# Patient Record
Sex: Female | Born: 1980 | Hispanic: No | Marital: Married | Smoking: Never smoker
Health system: Southern US, Community
[De-identification: ages and names within clinical notes are randomized; demographics above are authoritative.]

## PROBLEM LIST (undated history)

## (undated) DIAGNOSIS — I10 Essential (primary) hypertension: Secondary | ICD-10-CM

---

## 2018-12-10 ENCOUNTER — Other Ambulatory Visit (HOSPITAL_COMMUNITY): Payer: Self-pay | Admitting: *Deleted

## 2018-12-10 DIAGNOSIS — N61 Mastitis without abscess: Secondary | ICD-10-CM

## 2018-12-13 ENCOUNTER — Telehealth (HOSPITAL_COMMUNITY): Payer: Self-pay | Admitting: *Deleted

## 2018-12-13 ENCOUNTER — Encounter (HOSPITAL_COMMUNITY): Payer: Self-pay | Admitting: *Deleted

## 2018-12-13 NOTE — Telephone Encounter (Signed)
Telephoned patient at home number. Confirmed appointment for March 31. No symptoms of COVID-19. No travel outside of [place] in the last 14 days. No contact with someone with a confirmed diagnosis of COVID-19   

## 2018-12-14 ENCOUNTER — Ambulatory Visit
Admission: RE | Admit: 2018-12-14 | Discharge: 2018-12-14 | Disposition: A | Discharge status: Home / Self Care | Payer: No Typology Code available for payment source | Source: Ambulatory Visit | Attending: Obstetrics and Gynecology | Admitting: Obstetrics and Gynecology

## 2018-12-14 ENCOUNTER — Other Ambulatory Visit (HOSPITAL_COMMUNITY): Payer: Self-pay | Admitting: Obstetrics and Gynecology

## 2018-12-14 ENCOUNTER — Other Ambulatory Visit: Payer: Self-pay

## 2018-12-14 ENCOUNTER — Ambulatory Visit (HOSPITAL_COMMUNITY)
Admission: RE | Admit: 2018-12-14 | Discharge: 2018-12-14 | Disposition: A | Discharge status: Home / Self Care | Payer: Self-pay | Source: Ambulatory Visit | Attending: Obstetrics and Gynecology | Admitting: Obstetrics and Gynecology

## 2018-12-14 ENCOUNTER — Encounter (HOSPITAL_COMMUNITY): Payer: Self-pay

## 2018-12-14 VITALS — BP 146/102 | Temp 97.9°F | Wt 129.0 lb

## 2018-12-14 DIAGNOSIS — N6310 Unspecified lump in the right breast, unspecified quadrant: Secondary | ICD-10-CM

## 2018-12-14 DIAGNOSIS — N63 Unspecified lump in unspecified breast: Secondary | ICD-10-CM

## 2018-12-14 DIAGNOSIS — R599 Enlarged lymph nodes, unspecified: Secondary | ICD-10-CM

## 2018-12-14 DIAGNOSIS — N61 Mastitis without abscess: Secondary | ICD-10-CM

## 2018-12-14 DIAGNOSIS — Z1239 Encounter for other screening for malignant neoplasm of breast: Secondary | ICD-10-CM

## 2018-12-14 DIAGNOSIS — N6325 Unspecified lump in the left breast, overlapping quadrants: Secondary | ICD-10-CM

## 2018-12-14 DIAGNOSIS — N6320 Unspecified lump in the left breast, unspecified quadrant: Secondary | ICD-10-CM

## 2018-12-14 DIAGNOSIS — N644 Mastodynia: Secondary | ICD-10-CM

## 2018-12-14 HISTORY — DX: Essential (primary) hypertension: I10

## 2018-12-14 NOTE — Progress Notes (Signed)
Complaints of right breast pain and a change in the vein pattern since February 2020. Patient states the pain is constant. Patient rates the pain at a 10 out of 10. Patient is currently breast feeding and taking Bactrim.  Pap Smear: Pap smear not completed today. Last Pap smear was 06/13/2016 at the [facility] and normal. Per patient has no history of an abnormal Pap smear. Last Pap smear result is in Epic.  Physical exam: Breasts Right breast larger than left breast that per patient has been a change over the past month. No skin abnormalities left breast. Right breast reddened around the areola and a prominent vein observed within the right outer breast that per patient has been a change. No nipple retraction bilateral breasts. No nipple discharge bilateral breasts. No lymphadenopathy bilateral breasts. Right breast swollen. Palpated a lump within the left breast at 3 o'clock 7 cm from the nipple. No discrete palpable mass within the right breast. Complaints of right breast tenderness on exam. Referred patient to the [facility] for a diagnostic mammogram and bilateral breast ultrasound. Appointment scheduled for Tuesday, December 14, 2018 at 1445.        Pelvic/Bimanual No Pap smear completed today since last Pap smear was 06/13/2016. Pap smear not indicated per BCCCP guidelines.   Smoking History: Patient has never smoked.  Patient Navigation: Patient education provided. Access to services provided for patient through [facility] program. [employer] Spire interpreter provided.   Breast and Cervical Cancer Risk Assessment: Patient has no family history of breast cancer, known genetic mutations, or radiation treatment to the chest before age 58. Patient has no history of cervical dysplasia, immunocompromised, or DES exposure in-utero.  Risk Assessment    Risk Scores      12/14/2018   Last edited by: Lynnell Dike, LPN   5-year risk: 0.3 %   Lifetime risk: 6.8 %         Used [place] interpreter [employer] from [employer].

## 2018-12-14 NOTE — Patient Instructions (Signed)
Explained breast self awareness with Henry Milnes. Patient did not need a Pap smear today due to last Pap smear was 06/13/2016. Let her know BCCCP will cover Pap smears every 3 years unless has a history of abnormal Pap smears. Referred patient to the [facility] for a diagnostic mammogram and bilateral breast ultrasound. Appointment scheduled for Tuesday, December 14, 2018 at 1445. Patient aware of appointment and will be there. Burkley Basu verbalized understanding.  Brannock, Kathaleen Maser, RN 2:38 PM

## 2018-12-15 ENCOUNTER — Other Ambulatory Visit: Payer: Self-pay

## 2019-01-27 ENCOUNTER — Encounter (HOSPITAL_COMMUNITY): Payer: Self-pay | Admitting: *Deleted

## 2019-11-19 IMAGING — MG MM CLIP PLACEMENT
3 series · 3 of 3 positions shown · non-contrast
Comparison: Previous exam(s).

CLINICAL DATA: Post biopsy mammogram of the right breast for clip
placement.

EXAM:
DIAGNOSTIC RIGHT MAMMOGRAM POST ULTRASOUND BIOPSY

[R ML]
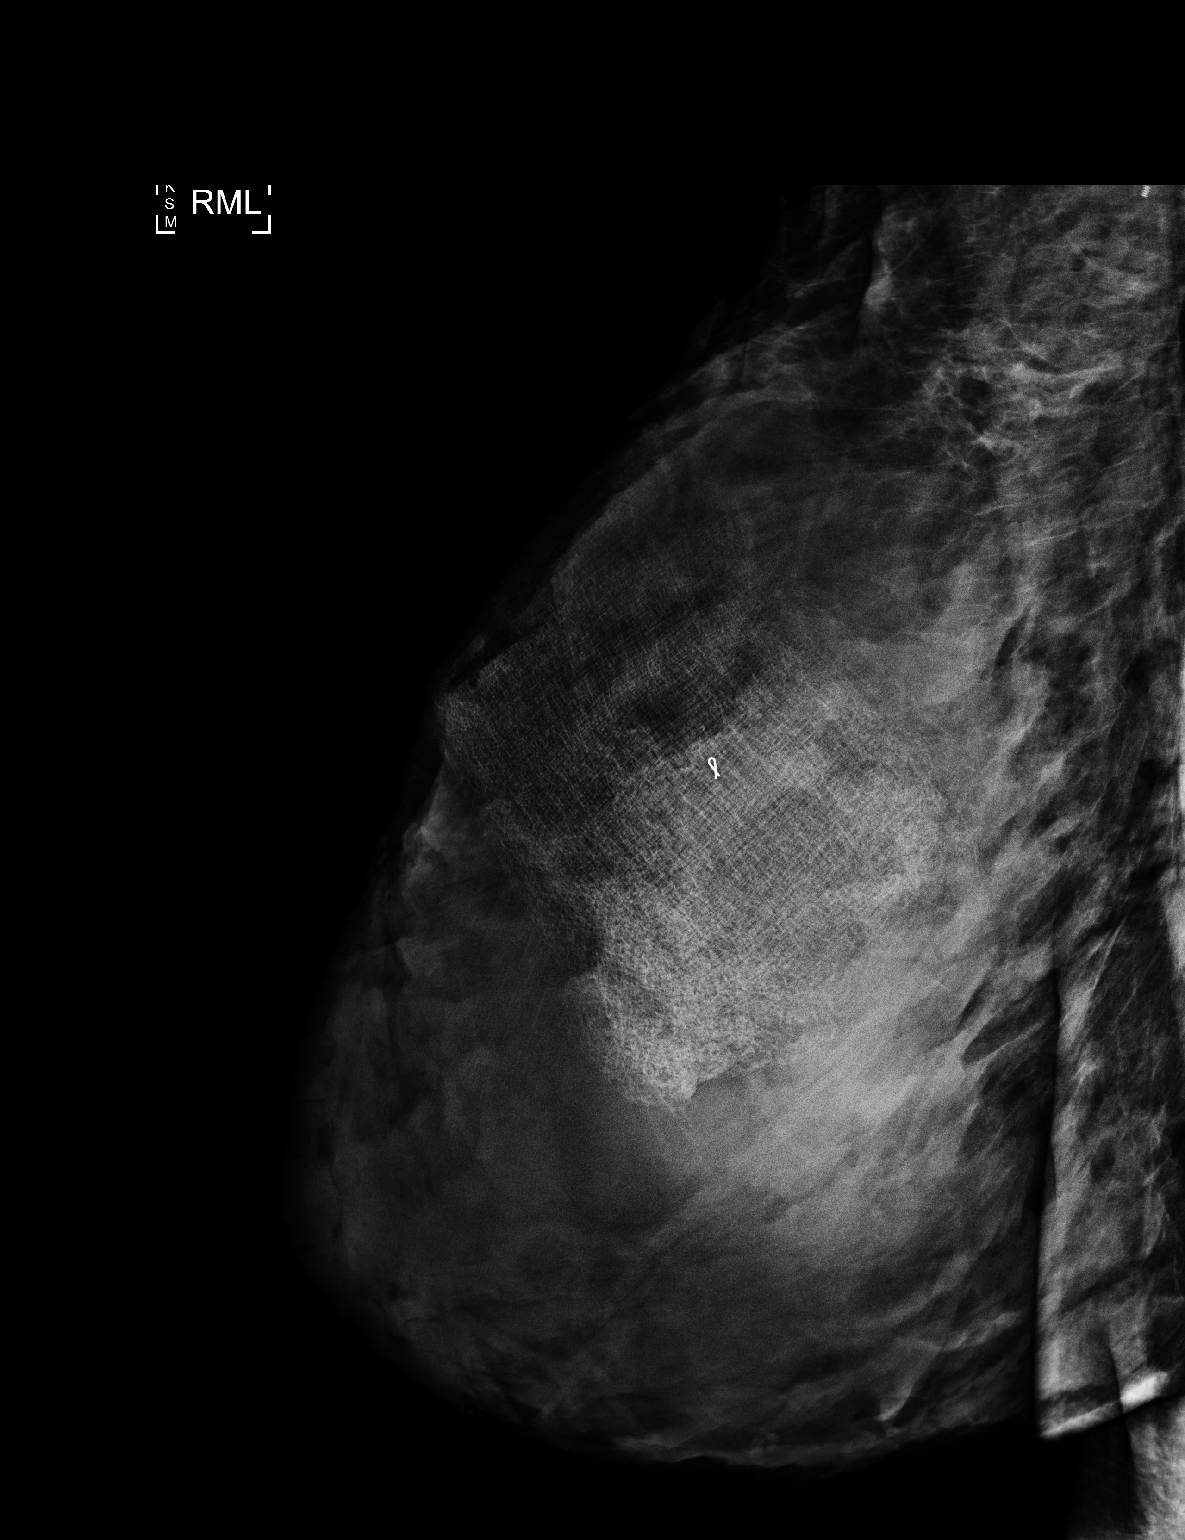

[R MLO]
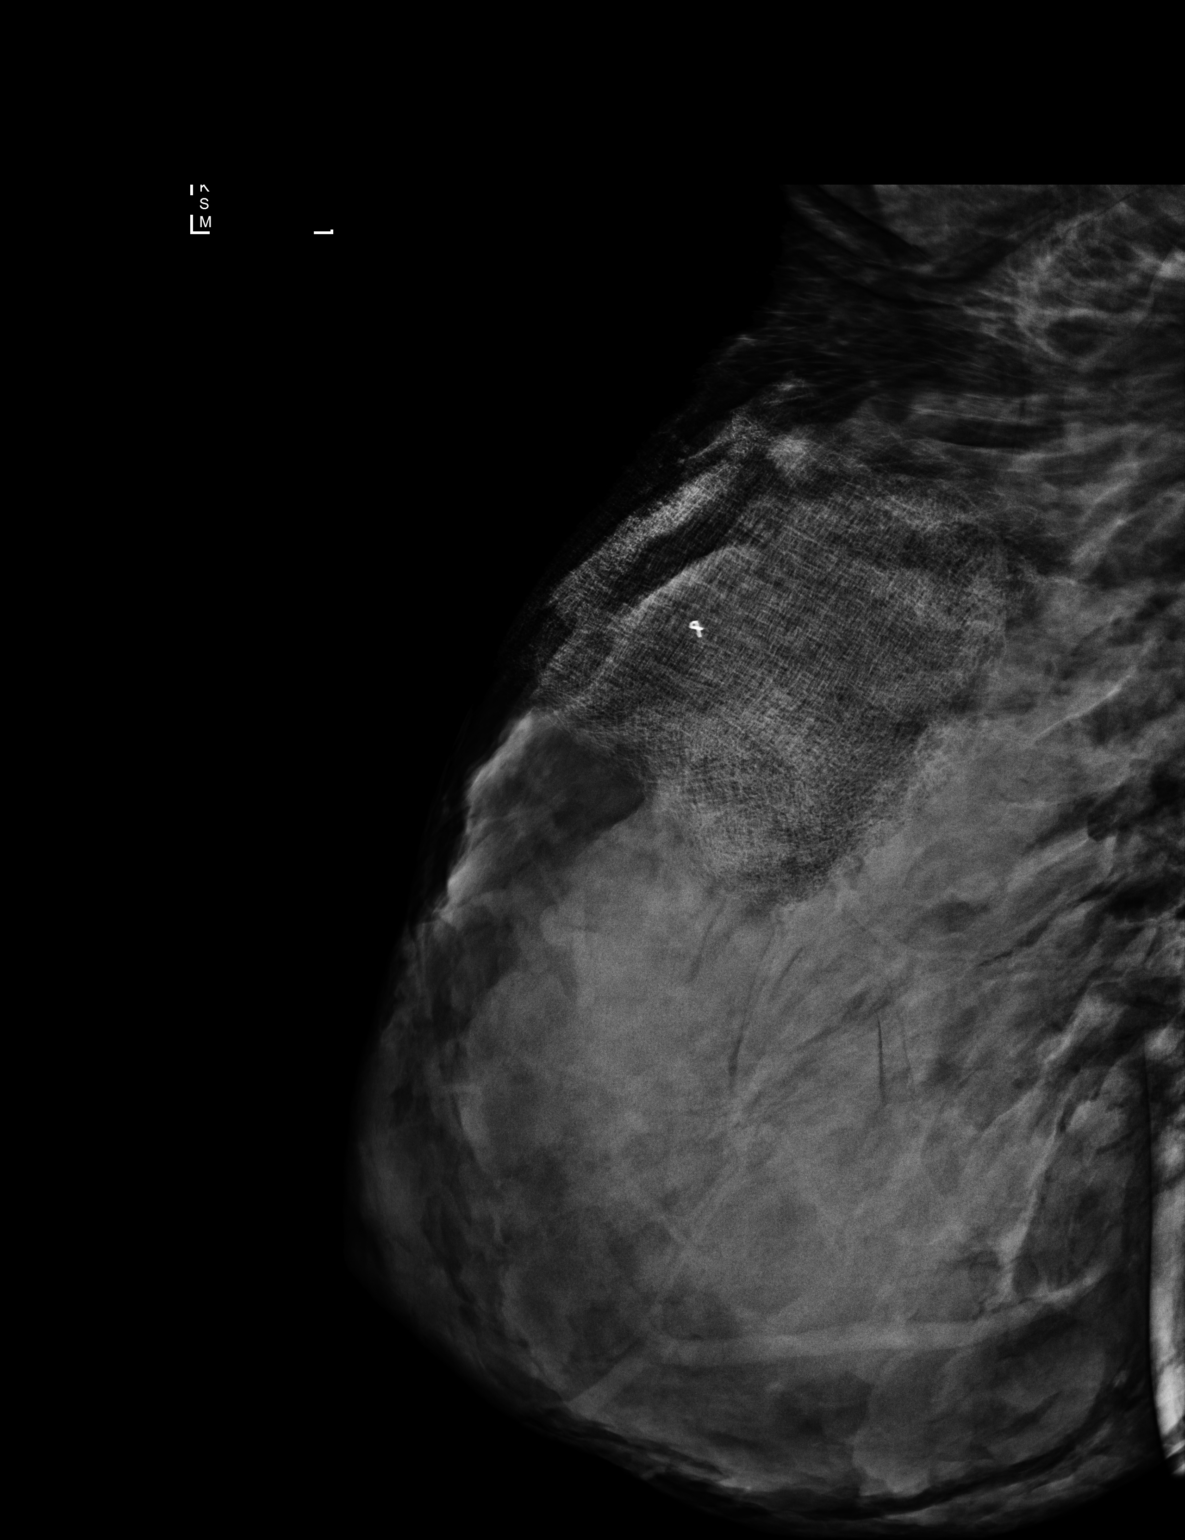

[R CC]
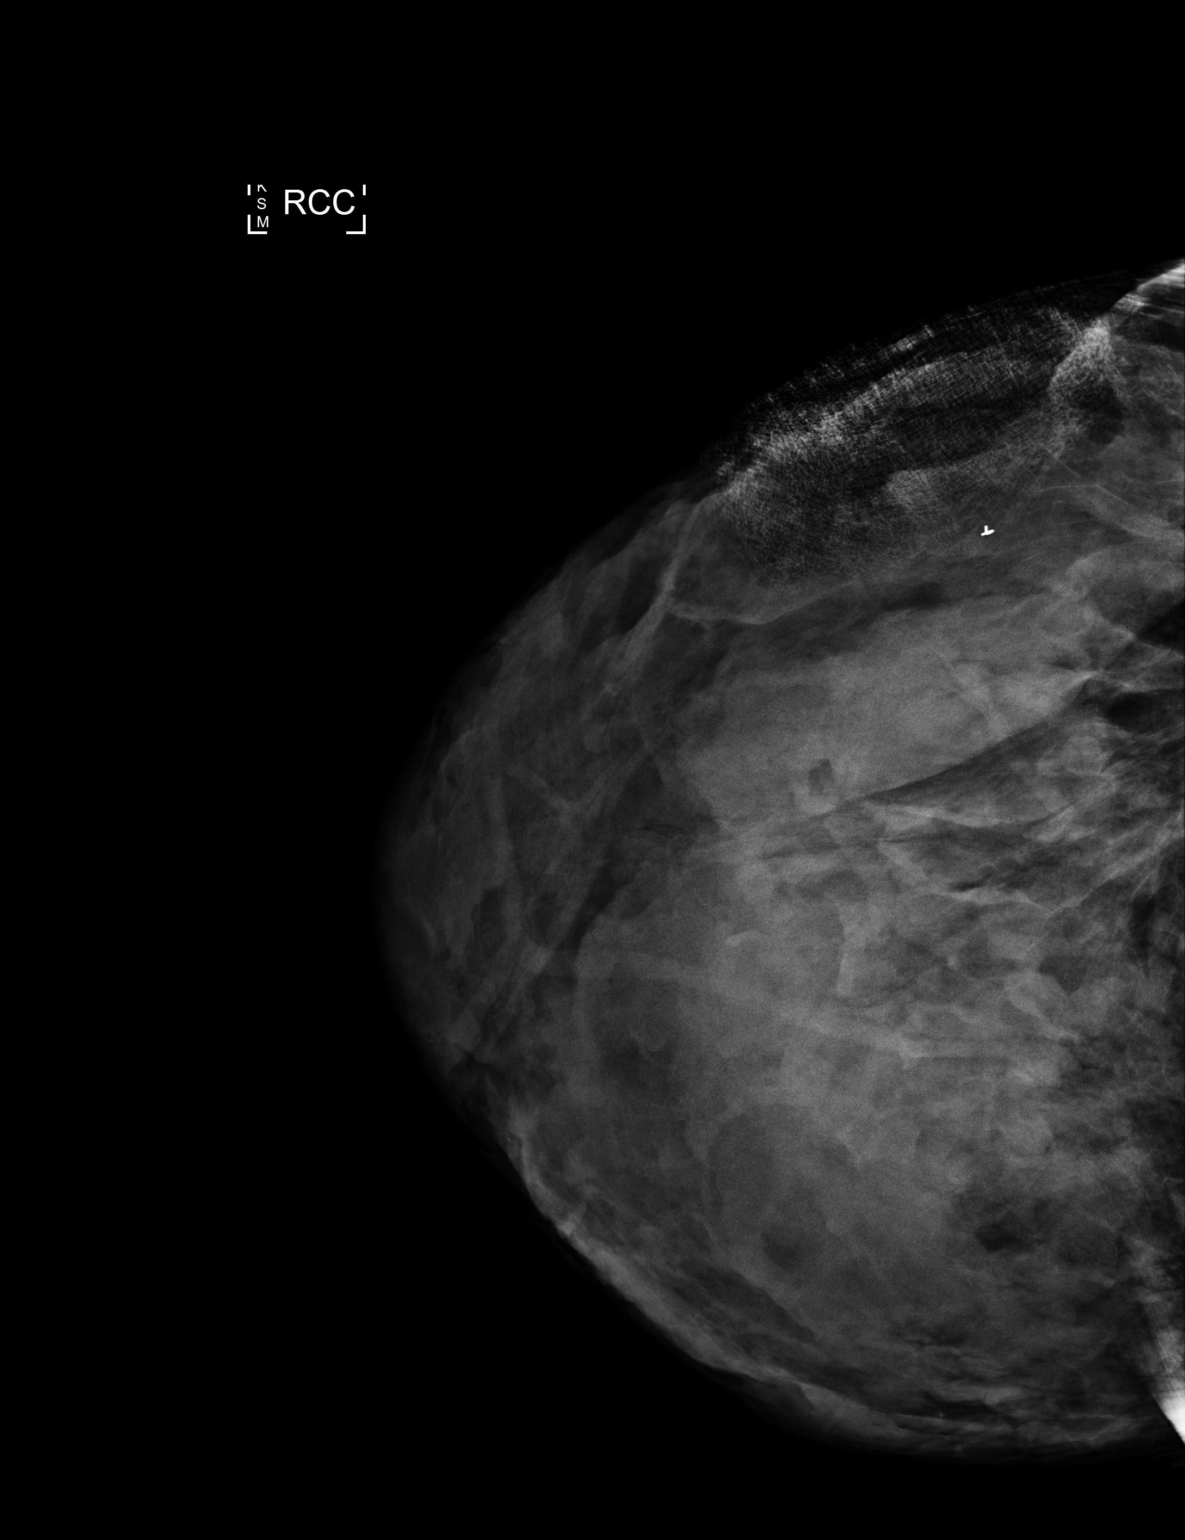

[3 of 3 positions shown; findings below may reference images not displayed]

FINDINGS: Mammographic images were obtained following ultrasound guided biopsy
of an area of shadowing in the upper-outer right breast and a right
axillary lymph node. The ribbon shaped biopsy marking clip is
appropriately positioned in the upper-outer quadrant of the right
breast. The spiral shaped HydroMARK clip is seen in the right
axilla.
IMPRESSION: 1. Appropriate positioning of the ribbon shaped biopsy marking clip
in the upper-outer right breast.

2. Appropriate positioning of the HydroMARK spiral shaped clip in
the right axilla.

Final Assessment: Post Procedure Mammograms for Marker Placement

## 2019-11-19 IMAGING — US US BREAST BX W LOC DEV 1ST LESION IMG BX SPEC US GUIDE*R*
1 series · 14 of 16 positions shown · non-contrast
Comparison: Previous exam(s).
COMPARISON: Previous exam(s).
COMPARISON: Previous exam(s).

Addendum:
CLINICAL DATA: 38-year-old female presenting for ultrasound-guided
biopsy of a right breast area of shadowing and a right axillary
lymph node.

EXAM:
ULTRASOUND GUIDED RIGHT BREAST CORE NEEDLE BIOPSY

[Series 1: us breast bx w loc dev 1st lesion img bx spec us g · 0.07mm/px · 14 of 16 slices shown]
[im 1/16]
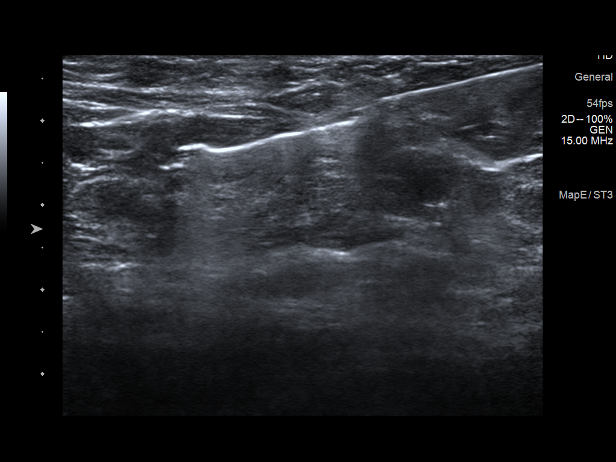
[im 2/16]
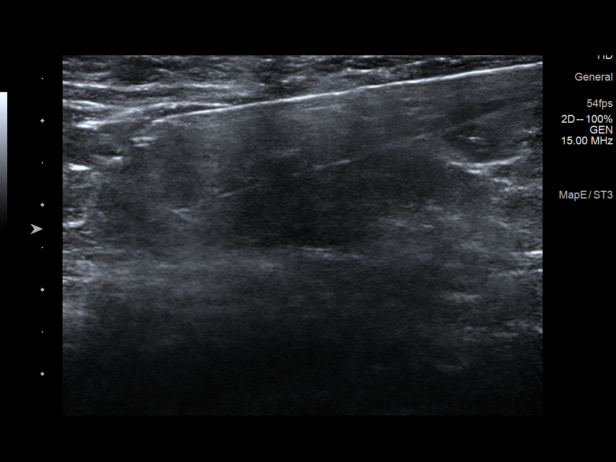
[im 3/16]
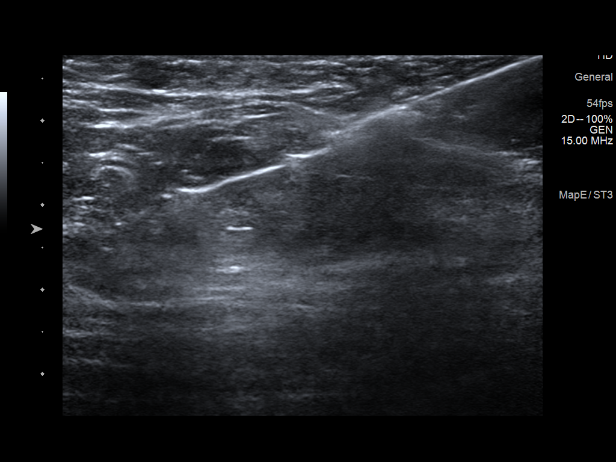
[im 5/16]
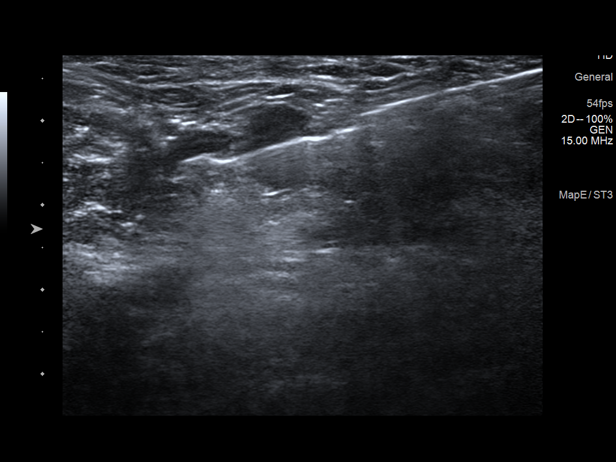
[im 6/16]
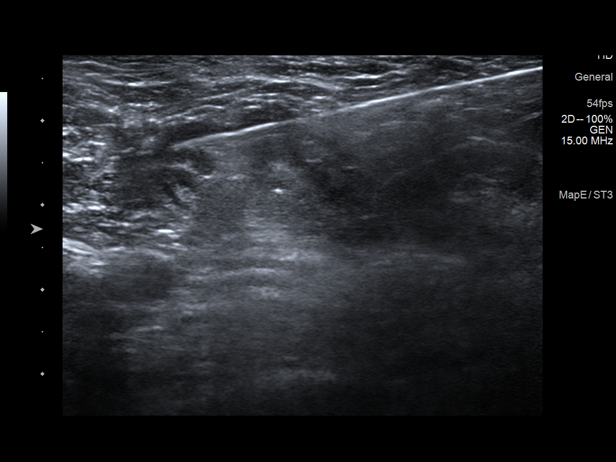
[im 7/16]
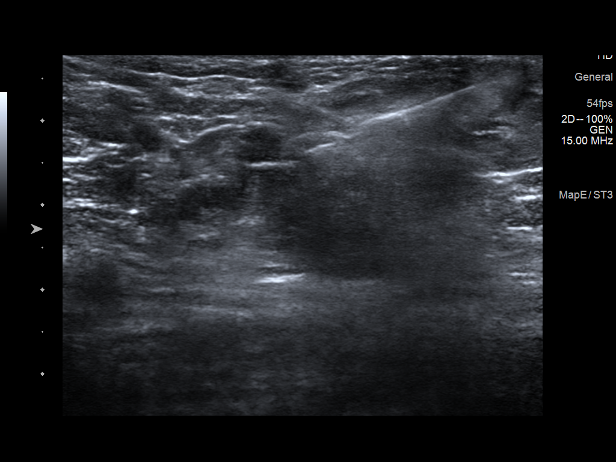
[im 8/16]
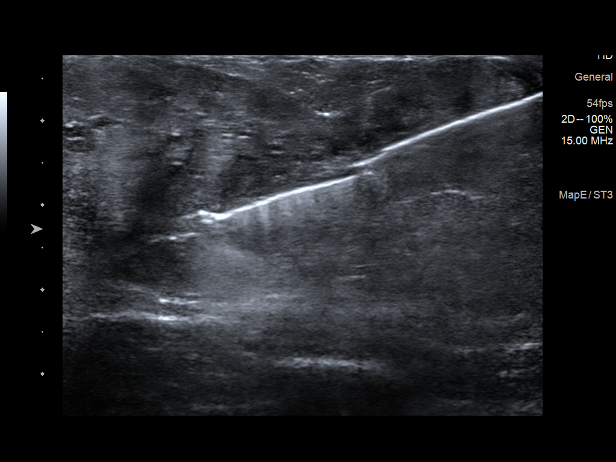
[im 9/16]
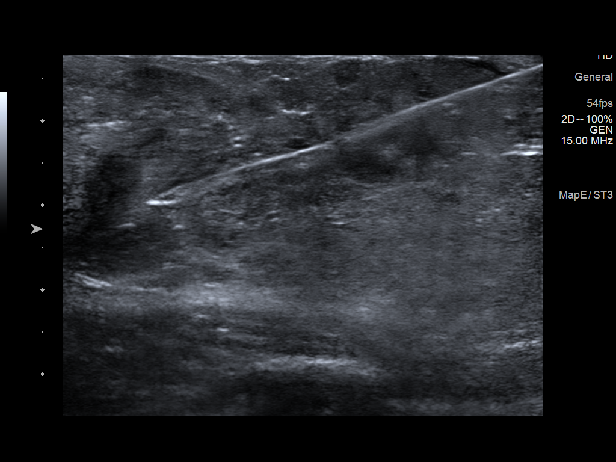
[im 10/16]
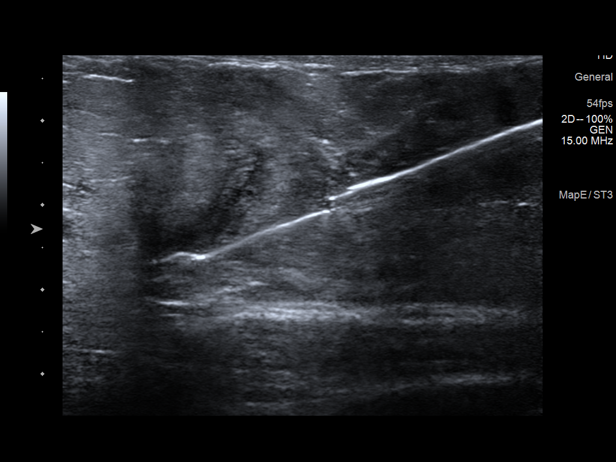
[im 11/16]
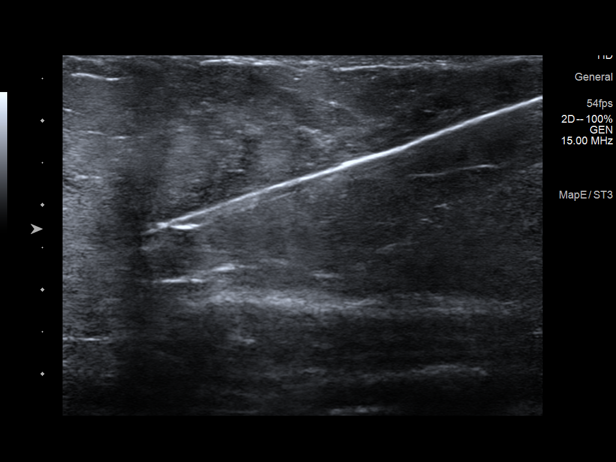
[im 13/16]
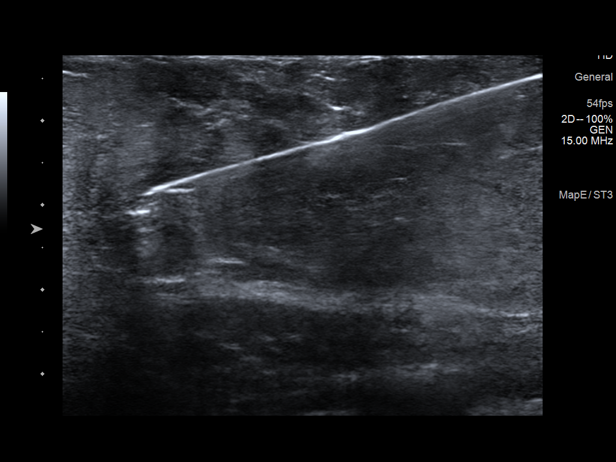
[im 14/16]
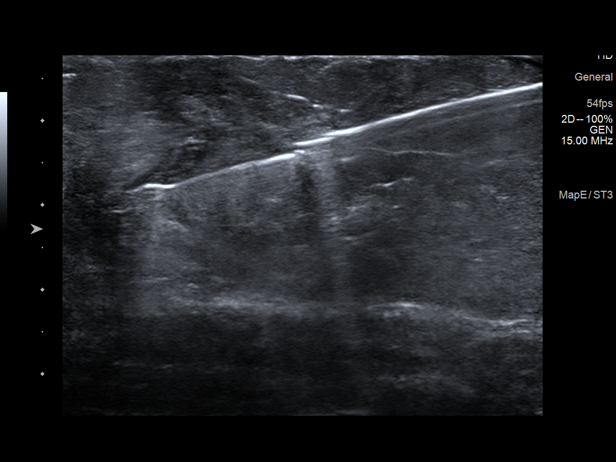
[im 15/16]
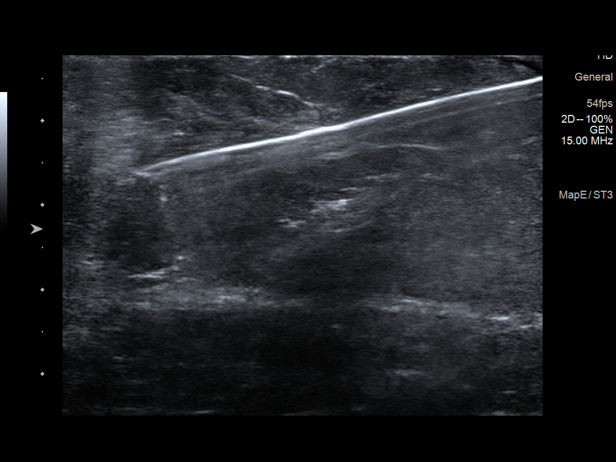
[im 16/16]
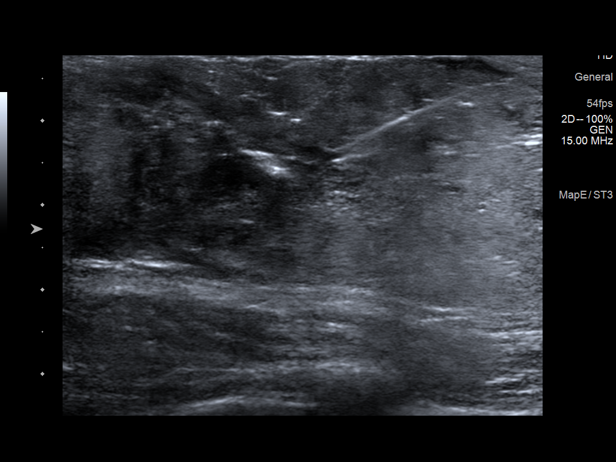

[14 of 16 positions shown; findings below may reference images not displayed]



#1 Lesion quadrant: Upper-outer quadrant

Using sterile technique and 1% Lidocaine as local anesthetic, under
direct ultrasound visualization, a 14 gauge Edo device was
used to perform biopsy of an area of shadowing in the upper-outer
right breast using an inferior approach. At the conclusion of the
procedure a ribbon shaped tissue marker clip was deployed into the
biopsy cavity.

--------------------------------------------------------------------------------------------------------------------------------------------

#2 Lesion quadrant: Right axilla

Using sterile technique and 1% Lidocaine as local anesthetic, under
direct ultrasound visualization, a 14 gauge Edo device was
used to perform biopsy of a right axillary lymph node using an
inferolateral approach. At the conclusion of the procedure a
HydroMARK spiral shaped tissue marker clip was deployed into the
biopsy cavity.

Follow up 2 view mammogram was performed and dictated separately.
IMPRESSION: 1. Ultrasound guided biopsy of right breast area of shadowing in the
upper-outer quadrant. No apparent complications.

2. Ultrasound guided biopsy of a right axillary lymph node. No
apparent complications.

ADDENDUM:
In addition to the routine consent, I also advised the patient that
there is risk of a milk fistula (typically a very rare occurence)
when doing a biopsy in the breast of a lactating woman.

ADDENDUM:
Pathology revealed BENIGN REACTIVE LYMPH NODE of the RIGHT axilla.
BENIGN BREAST PARENCHYMA WITH LACTATIONAL CHANGES of the RIGHT
breast, 10 o'clock. This was found to be concordant by Dr. Zylia
Cat.

Multiple attempts to reach the patient by telephone, using [employer] on [DATE],[DATE], and December 17, 2018, were
unsuccessful. Multiple voice mails were left in the patient's native
language, [place], this was also unsuccessful. Therefore, Babie
Gotu, RN, and Kikuchi, [place], [REDACTED] nurses for [HOSPITAL], were notified of biopsy results and further recommendations.

Clinical follow-up recommended for the LEFT breast palpable mass as
well as her RIGHT breast. Given the asymmetric enlargement of the
right breast with skin thickening, additional workup to consider
includes skin punch biopsy and/or MRI for further evaluation,
particularly to exclude the possibility of an inflammatory breast
cancer. The [REDACTED] nurses were instructed to have the patient
continue with monthly self breast examinations and annual screening
mammography at 40 years old is recommended.

Pathology results reported by Shahbaz Fenelon, RN on 12/20/2018.

*** End of Addendum ***
Addendum:
FINDINGS: I met with the patient and we discussed the procedure of
ultrasound-guided biopsy, including benefits and alternatives. We
discussed the high likelihood of a successful procedure. We
discussed the risks of the procedure, including infection, bleeding,
tissue injury, clip migration, and inadequate sampling. Informed
written consent was given. The usual time-out protocol was performed
immediately prior to the procedure.

#1 Lesion quadrant: Upper-outer quadrant

Using sterile technique and 1% Lidocaine as local anesthetic, under
direct ultrasound visualization, a 14 gauge Edo device was
used to perform biopsy of an area of shadowing in the upper-outer
right breast using an inferior approach. At the conclusion of the
procedure a ribbon shaped tissue marker clip was deployed into the
biopsy cavity.

--------------------------------------------------------------------------------------------------------------------------------------------

#2 Lesion quadrant: Right axilla

Using sterile technique and 1% Lidocaine as local anesthetic, under
direct ultrasound visualization, a 14 gauge Edo device was
used to perform biopsy of a right axillary lymph node using an
inferolateral approach. At the conclusion of the procedure a
HydroMARK spiral shaped tissue marker clip was deployed into the
biopsy cavity.

Follow up 2 view mammogram was performed and dictated separately.
IMPRESSION: 1. Ultrasound guided biopsy of right breast area of shadowing in the
upper-outer quadrant. No apparent complications.

2. Ultrasound guided biopsy of a right axillary lymph node. No
apparent complications.

ADDENDUM:
In addition to the routine consent, I also advised the patient that
there is risk of a milk fistula (typically a very rare occurence)
when doing a biopsy in the breast of a lactating woman.



#1 Lesion quadrant: Upper-outer quadrant

Using sterile technique and 1% Lidocaine as local anesthetic, under
direct ultrasound visualization, a 14 gauge Edo device was
used to perform biopsy of an area of shadowing in the upper-outer
right breast using an inferior approach. At the conclusion of the
procedure a ribbon shaped tissue marker clip was deployed into the
biopsy cavity.

--------------------------------------------------------------------------------------------------------------------------------------------

#2 Lesion quadrant: Right axilla

Using sterile technique and 1% Lidocaine as local anesthetic, under
direct ultrasound visualization, a 14 gauge Edo device was
used to perform biopsy of a right axillary lymph node using an
inferolateral approach. At the conclusion of the procedure a
HydroMARK spiral shaped tissue marker clip was deployed into the
biopsy cavity.

Follow up 2 view mammogram was performed and dictated separately.
IMPRESSION: 1. Ultrasound guided biopsy of right breast area of shadowing in the
upper-outer quadrant. No apparent complications.

2. Ultrasound guided biopsy of a right axillary lymph node. No
apparent complications.

## 2019-11-19 IMAGING — US ULTRASOUND LEFT BREAST LIMITED
1 series · 3 of 3 positions shown · non-contrast
Comparison: None.

CLINICAL DATA: 38-year-old breast feeding female presenting for
concern of the right breast. She states she had a crawling sensation
in the right breast which started in [REDACTED]. This progressively got
worse, as the breast enlarged and more recently became painful. She
has been on Bactrim for the past 5 days for possible mastitis. She
had a physical exam recently with a possible palpable lump in the
left breast.

EXAM:
DIGITAL DIAGNOSTIC BILATERAL MAMMOGRAM WITH CAD AND TOMO
ULTRASOUND BILATERAL BREAST

[Series 1: ultrasound left breast limited · 0.06mm/px · 3 of 3 slices shown]
[im 1/3]
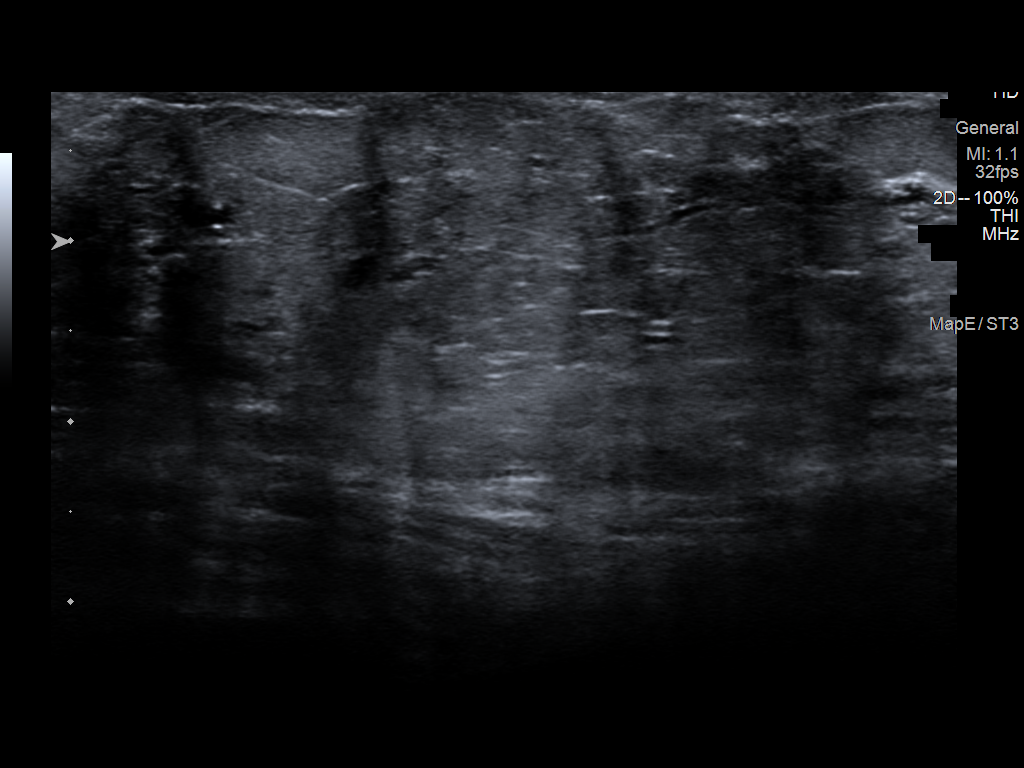
[im 2/3]
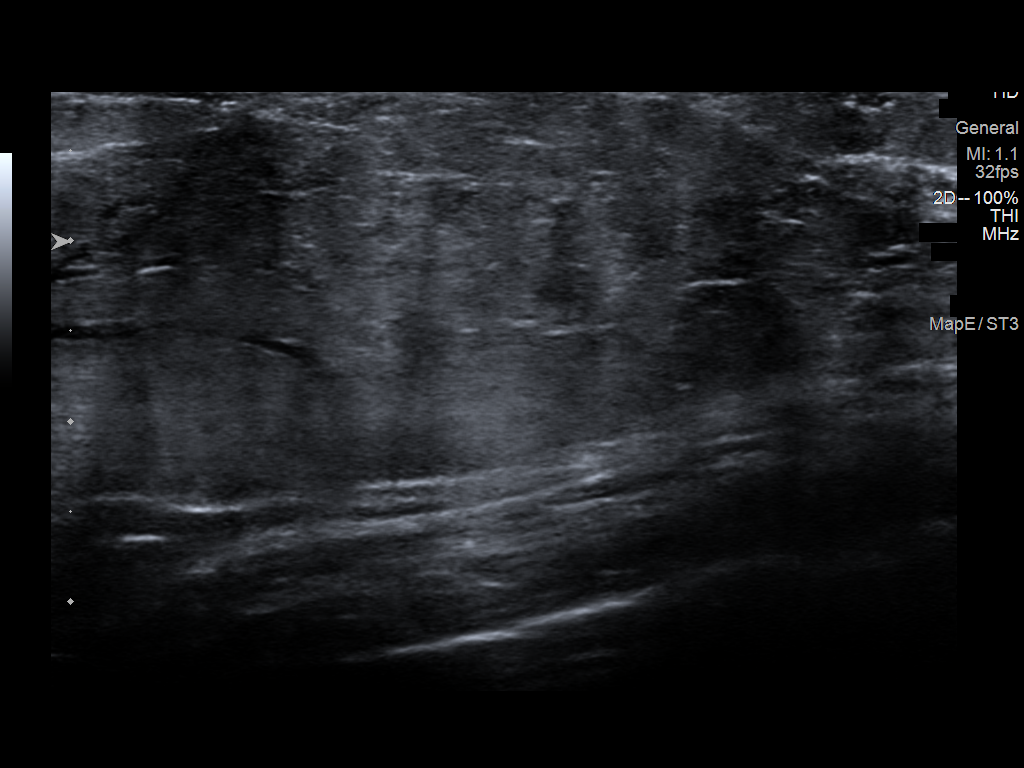
[im 3/3]
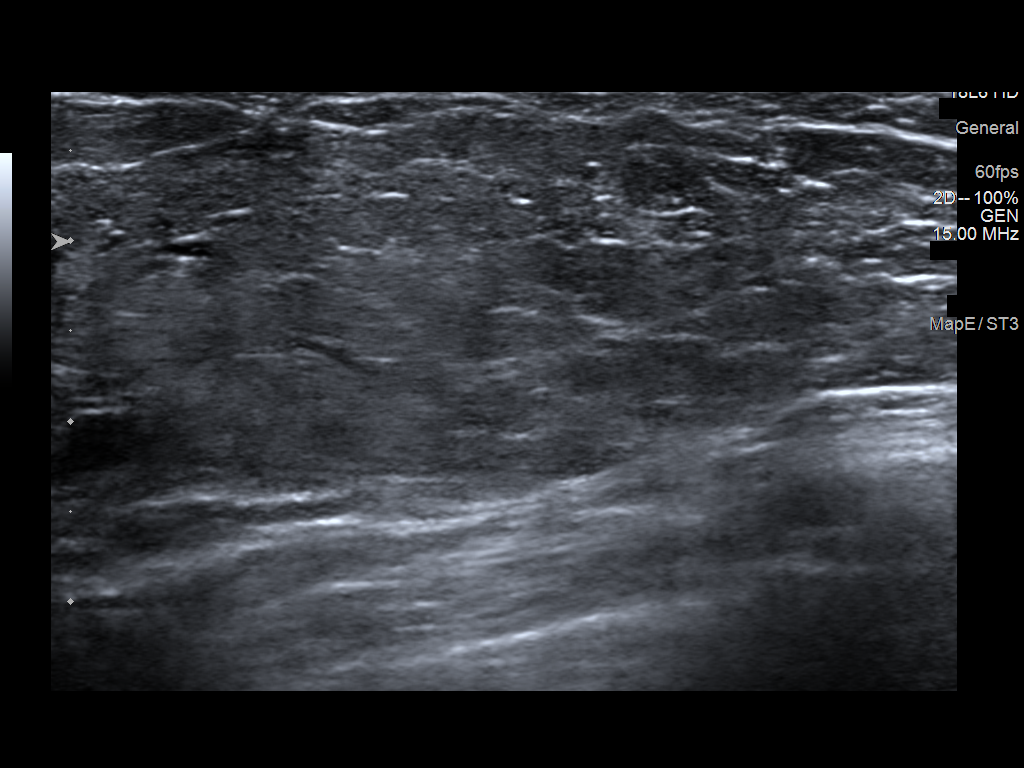

[3 of 3 positions shown; findings below may reference images not displayed]

ACR Breast Density Category d: The breast tissue is extremely dense,
which lowers the sensitivity of mammography.
FINDINGS: There is significant asymmetric enlargement of the right breast. The
both breasts are extremely dense, the right breast is more dense
than the left. There is no evidence of skin thickening. A BB has
been placed on the upper-outer quadrant of the left breast
indicating the palpable site of concern. No suspicious masses are
identified immediately deep to the palpable marker. No other
suspicious calcifications, masses or areas of distortion are seen in
the bilateral breasts.

Mammographic images were processed with CAD.

On physical exam, the right breast is significantly larger than the
left breast and is diffusely mildly dark as compared to the left. No
evidence of peau d'[REDACTED]. No significant warmth. There is no
discrete palpable lump identified.

At the palpable site in the upper-outer left breast there is a
discrete superficial lump the palpable area at approximately 3
o'clock.

Ultrasound of the entire right breast demonstrates no discrete
masses. In the lateral to upper-outer quadrant of the right breast
there is a vague area of shadowing measuring 2-3 cm. Numerous
prominent ducts are seen coursing through the right breast.
Ultrasound of the right axilla demonstrates 1 lobulated lymph node
with a thickened cortex measuring 5-6 mm.

Ultrasound of the palpable site in the left breast at 3 o'clock, 5
cm from the nipple demonstrates normal fibroglandular tissue. No
suspicious masses or areas of shadowing are identified.
IMPRESSION: 1. There is marked increased size and density of the left breast as
compared to the right, without skin thickening. There is an
indeterminate area of shadowing in the upper-outer quadrant of the
right breast sonographically. No other discrete abnormalities are
seen in the right breast.

2. Ultrasound of the right axilla demonstrates 1 thickened lobulated
lymph node.

3. Ultrasound of the palpable site of the left breast demonstrates
no mammographic or sonographic abnormalities.

RECOMMENDATION:
1. Ultrasound-guided biopsy is recommended for the area of shadowing
in the upper-outer right breast and for the right axillary lymph
node. This will be performed today and dictated in a separate
report.

2. Management of the palpable site should be based on degree of
clinical concern, which will be influenced by the pathology results.
This can be managed by physical exam, biopsy or MRI.

I have discussed the findings and recommendations with the patient.
Results were also provided in writing at the conclusion of the
visit. If applicable, a reminder letter will be sent to the patient
regarding the next appointment.

BI-RADS CATEGORY  4: Suspicious.

## 2020-08-14 ENCOUNTER — Other Ambulatory Visit: Payer: Self-pay | Admitting: Internal Medicine

## 2020-08-14 DIAGNOSIS — Z1231 Encounter for screening mammogram for malignant neoplasm of breast: Secondary | ICD-10-CM
